# Patient Record
Sex: Female | Born: 1975
Health system: Southern US, Community
[De-identification: ages and names within clinical notes are randomized; demographics above are authoritative.]

## PROBLEM LIST (undated history)

## (undated) DIAGNOSIS — I10 Essential (primary) hypertension: Secondary | ICD-10-CM

## (undated) HISTORY — DX: Essential (primary) hypertension: I10

---

## 2015-01-23 DIAGNOSIS — Z87891 Personal history of nicotine dependence: Secondary | ICD-10-CM | POA: Insufficient documentation

## 2015-01-23 DIAGNOSIS — Z0000 Encounter for general adult medical examination without abnormal findings: Secondary | ICD-10-CM | POA: Insufficient documentation

## 2015-10-16 DIAGNOSIS — K219 Gastro-esophageal reflux disease without esophagitis: Secondary | ICD-10-CM | POA: Insufficient documentation

## 2016-05-23 DIAGNOSIS — Z6841 Body Mass Index (BMI) 40.0 and over, adult: Secondary | ICD-10-CM | POA: Insufficient documentation

## 2016-12-16 ENCOUNTER — Ambulatory Visit: Payer: Self-pay | Admitting: Podiatry

## 2016-12-25 ENCOUNTER — Ambulatory Visit (INDEPENDENT_AMBULATORY_CARE_PROVIDER_SITE_OTHER): Payer: BLUE CROSS/BLUE SHIELD

## 2016-12-25 ENCOUNTER — Ambulatory Visit (INDEPENDENT_AMBULATORY_CARE_PROVIDER_SITE_OTHER): Payer: BLUE CROSS/BLUE SHIELD | Admitting: Podiatry

## 2016-12-25 ENCOUNTER — Encounter: Payer: Self-pay | Admitting: Podiatry

## 2016-12-25 DIAGNOSIS — M779 Enthesopathy, unspecified: Secondary | ICD-10-CM | POA: Diagnosis not present

## 2016-12-25 DIAGNOSIS — M7661 Achilles tendinitis, right leg: Secondary | ICD-10-CM

## 2016-12-25 DIAGNOSIS — M25571 Pain in right ankle and joints of right foot: Secondary | ICD-10-CM

## 2016-12-25 DIAGNOSIS — M21619 Bunion of unspecified foot: Secondary | ICD-10-CM | POA: Diagnosis not present

## 2016-12-25 MED ORDER — DICLOFENAC SODIUM 75 MG PO TBEC: 75.0000 mg | DELAYED_RELEASE_TABLET | Freq: Two times a day (BID) | ORAL | 2 refills | Status: AC

## 2016-12-25 MED ORDER — TRIAMCINOLONE ACETONIDE 10 MG/ML IJ SUSP
10.0000 mg | Freq: Once | INTRAMUSCULAR | Status: AC
  Administered 2016-12-25: 10 mg

## 2016-12-25 NOTE — Progress Notes (Signed)
Chief Complaint  Patient presents with  . Foot Pain    Right; Back of heel pain that raidates to lateral side of ankle x 1 year. "Pain worsens the longer I stand"

## 2016-12-25 NOTE — Patient Instructions (Signed)

## 2016-12-25 NOTE — Progress Notes (Signed)
   Subjective:    Patient ID: Heidi Parker, female    DOB: 12-02-1975, 41 y.o.   MRN: [id number]  HPI  Chief Complaint  Patient presents with  . Foot Pain    Right; Back of heel pain that raidates to lateral side of ankle x 1 year. "Pain worsens the longer I stand"       Review of Systems     Objective:   Physical Exam        Assessment & Plan:

## 2016-12-26 NOTE — Progress Notes (Signed)
Subjective:    Patient ID: Heidi Parker, female   DOB: 41 y.o.   MRN: [id number]   HPI patient presents with severe discomfort in the back of the right heel stating it's been hurting her for around a year and gradually becoming more more of an issue    Review of Systems  All other systems reviewed and are negative.       Objective:  Physical Exam  Cardiovascular: Intact distal pulses.   Musculoskeletal: Normal range of motion.  Neurological: She is alert.  Skin: Skin is warm.  Nursing note and vitals reviewed.  neurovascular status intact muscle strength adequate range of motion within normal limits with patient found to have exquisite discomfort posterior lateral aspect right heel at the insertional point of the tendon the calcaneus with inflammation fluid as it inserts with no indications of tendon dysfunction and minimal equinus condition noted     Assessment:   Acute Achilles tendinitis right at the insertion into the calcaneus mostly lateral side      Plan:    H&P and education rendered concerning condition. At this point I did discuss injection and risk and I went ahead did a careful lateral injection 3 mg Dexon some Kenalog 5 mg Xylocaine and applied air fracture walker for complete immobilization and advised him reduced activity ice therapy placed on diclofenac 75 mg twice a day and reappoint to recheck in 2 weeks  X-rays indicate there is posterior spurring at the insertion of the tendon into the calcaneus

## 2017-01-22 ENCOUNTER — Ambulatory Visit (INDEPENDENT_AMBULATORY_CARE_PROVIDER_SITE_OTHER): Payer: BLUE CROSS/BLUE SHIELD | Admitting: Podiatry

## 2017-01-22 DIAGNOSIS — M7661 Achilles tendinitis, right leg: Secondary | ICD-10-CM

## 2017-01-22 NOTE — Progress Notes (Signed)
Subjective:    Patient ID: Heidi Parker, female   DOB: 41 y.o.   MRN: [id number]   HPI patient states that her posterior heel is feeling quite a bit better with immobilization    ROS      Objective:  Physical Exam neurovascular status intact with significant diminishment of discomfort in the posterior aspect of the heel with mild inflammation upon deep palpation     Assessment:   Achilles tendinitis improving with immobilization injection and ice therapy     Plan:    Advised on stretching exercises anti-inflammatories ice therapy and gradual reduction of the boot over the next 4 weeks. Dispensed a ankle compression stocking with silicone to reduce posterior pressure and advised on heel lift therapy

## 2018-01-26 ENCOUNTER — Telehealth: Payer: Self-pay | Admitting: *Deleted

## 2018-01-26 NOTE — Telephone Encounter (Signed)
Refill request for diclofenac. Pt has not been evaluated since 2018, and needs an appt. Return fax denying.

## 2018-06-03 ENCOUNTER — Other Ambulatory Visit: Payer: Self-pay | Admitting: Family Medicine

## 2018-06-03 ENCOUNTER — Ambulatory Visit
Admission: RE | Admit: 2018-06-03 | Discharge: 2018-06-03 | Disposition: A | Discharge status: Home / Self Care | Payer: Commercial Managed Care - PPO | Source: Ambulatory Visit | Attending: Family Medicine | Admitting: Family Medicine

## 2018-06-03 DIAGNOSIS — R200 Anesthesia of skin: Secondary | ICD-10-CM

## 2018-06-03 DIAGNOSIS — M542 Cervicalgia: Secondary | ICD-10-CM

## 2018-07-30 ENCOUNTER — Other Ambulatory Visit: Payer: Self-pay | Admitting: Neurosurgery

## 2018-07-30 DIAGNOSIS — M47812 Spondylosis without myelopathy or radiculopathy, cervical region: Secondary | ICD-10-CM

## 2018-08-07 ENCOUNTER — Ambulatory Visit
Admission: RE | Admit: 2018-08-07 | Discharge: 2018-08-07 | Disposition: A | Discharge status: Home / Self Care | Payer: Commercial Managed Care - PPO | Source: Ambulatory Visit | Attending: Neurosurgery | Admitting: Neurosurgery

## 2018-08-07 DIAGNOSIS — M47812 Spondylosis without myelopathy or radiculopathy, cervical region: Secondary | ICD-10-CM

## 2019-03-24 IMAGING — MR MR CERVICAL SPINE W/O CM
4 of 5 series · 27 of 48 positions shown · non-contrast
Comparison: Radiography 06/11/2018

CLINICAL DATA: Neck pain with bilateral arm weakness and hand
numbness over the last 6 months.

EXAM:
MRI CERVICAL SPINE WITHOUT CONTRAST
TECHNIQUE: Multiplanar, multisequence MR imaging of the cervical spine was
performed. No intravenous contrast was administered.

[Series 6: T1 · sagittal · 3.0mm · 0.66mm/px · 6 of 15 slices shown]
[im 1/15]
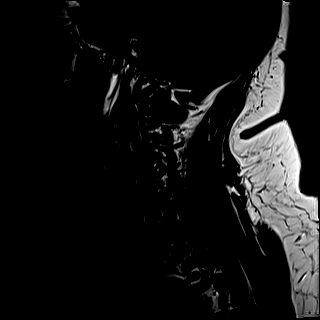
[im 3/15]
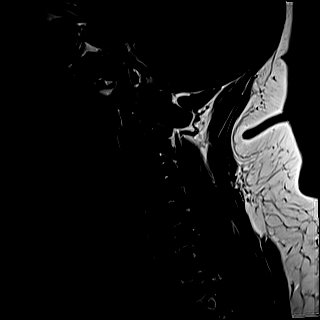
[im 6/15]
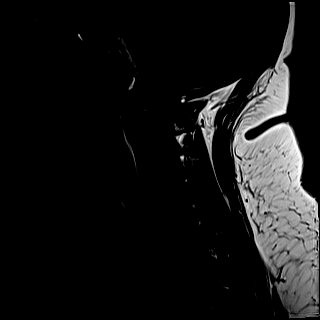
[im 9/15]
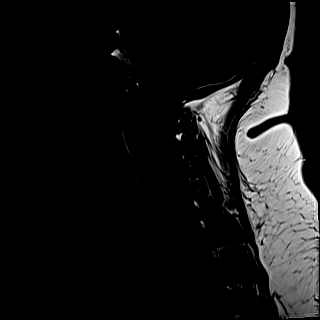
[im 12/15]
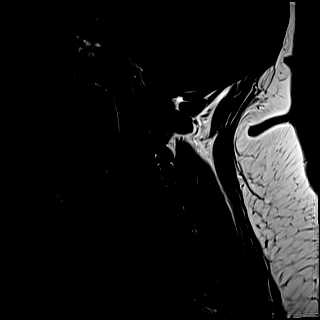
[im 15/15]
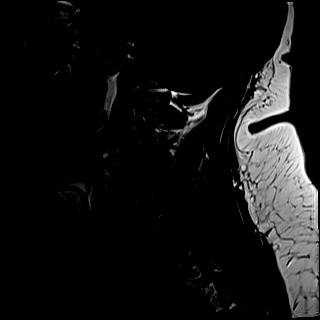

[Series 7: T2 · sagittal · 3.0mm · 0.55mm/px · 7 of 15 slices shown (1 of 2)]
[im 1/15]
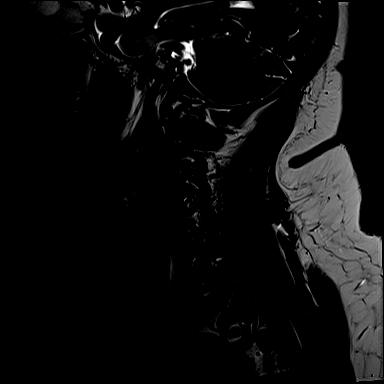
[im 3/15]
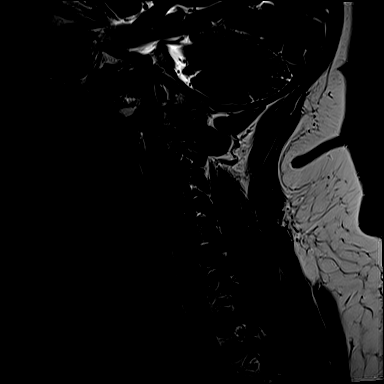
[im 5/15]
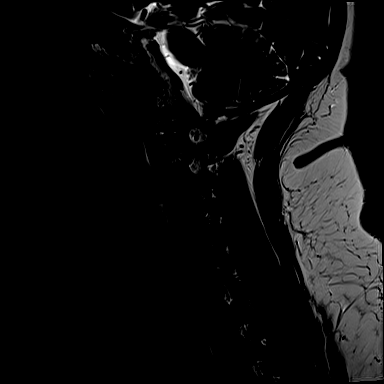
[im 8/15]
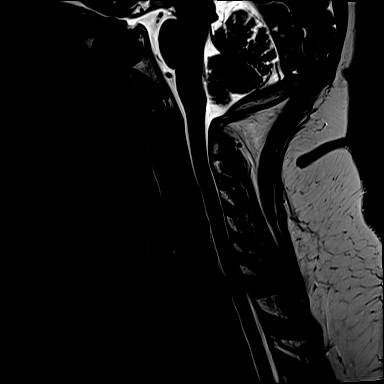
[im 10/15]
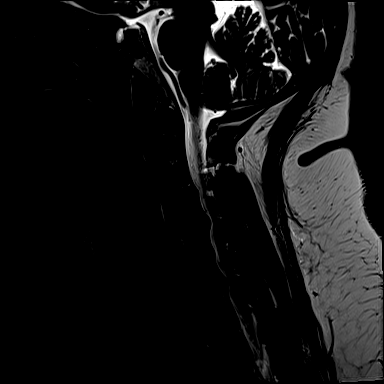
[im 12/15]
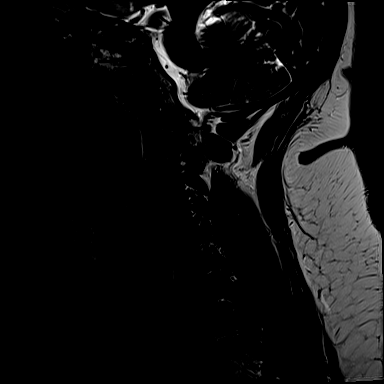
[im 15/15]
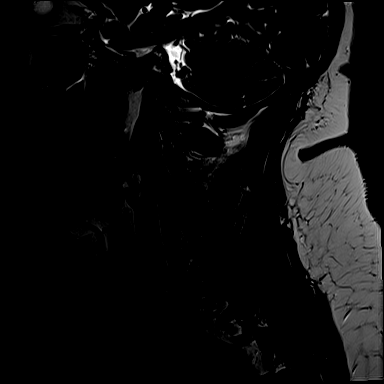

[Series 8: STIR · sagittal · 3.0mm · 0.33mm/px · 6 of 15 slices shown]
[im 1/15]
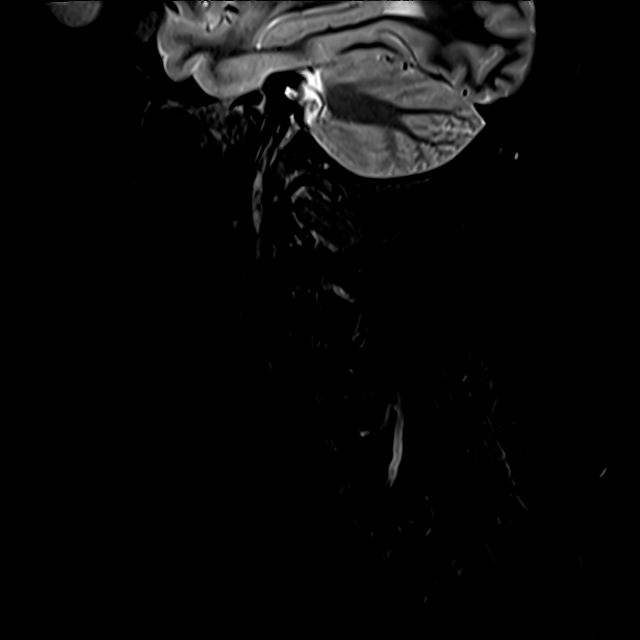
[im 3/15]
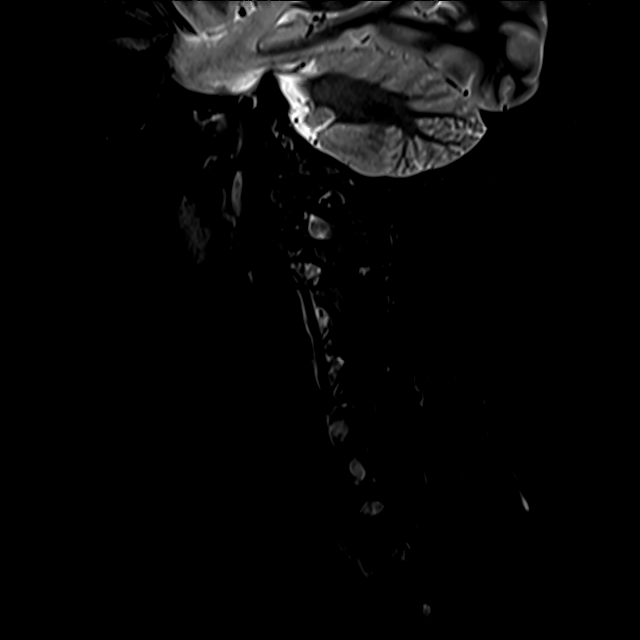
[im 5/15]
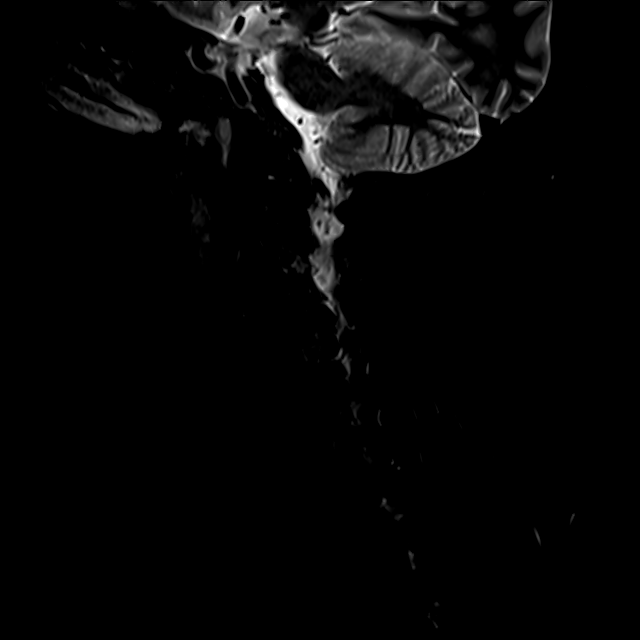
[im 8/15]
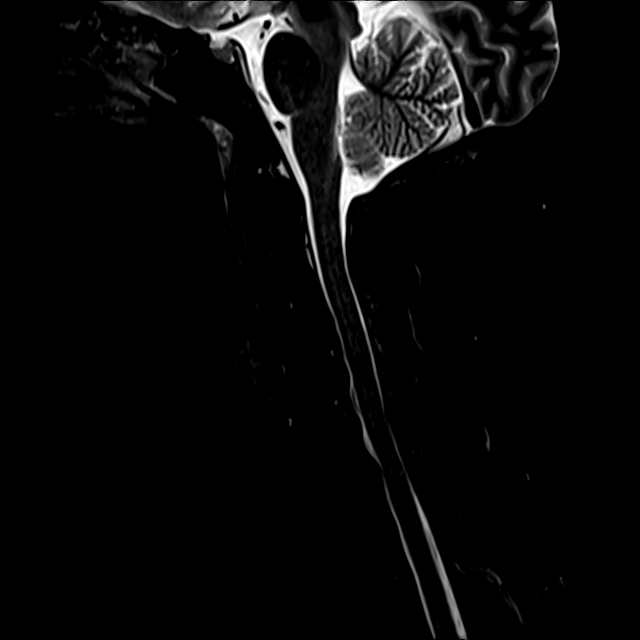
[im 10/15]
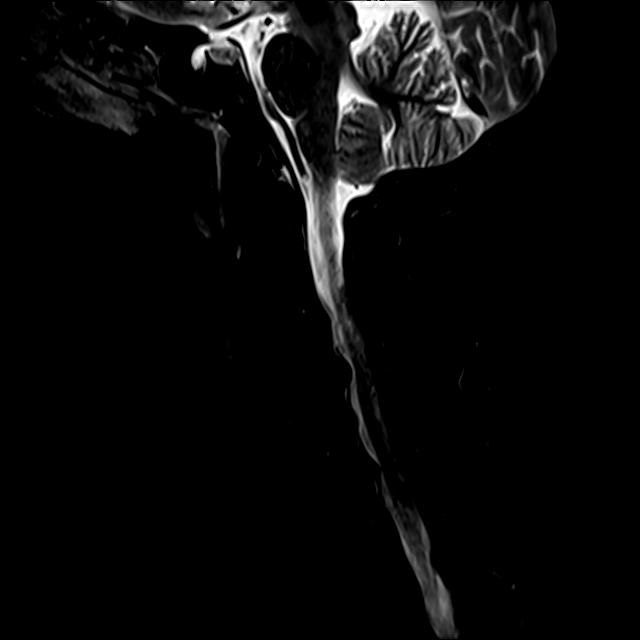
[im 12/15]
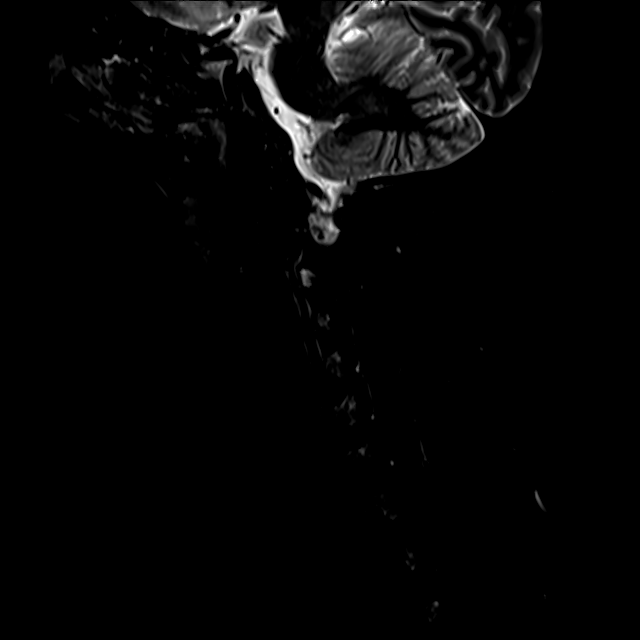

[Series 9: T2 · axial · 3.0mm · 0.50mm/px · z∈[-71,+25]mm · 8 of 32 slices shown (2 of 2)]
[im 1/32]
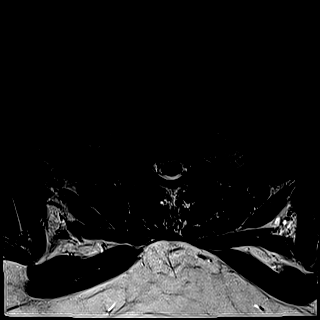
[im 5/32]
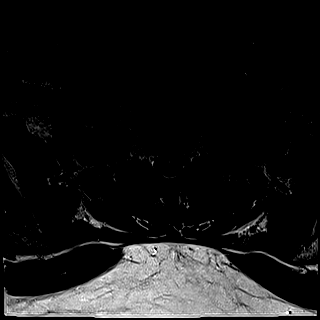
[im 10/32]
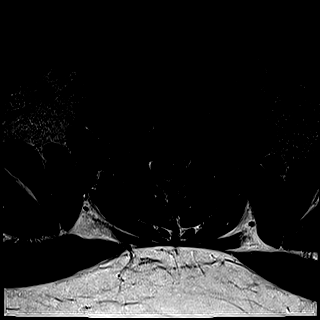
[im 15/32]
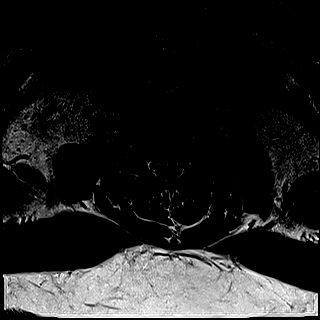
[im 17/32]
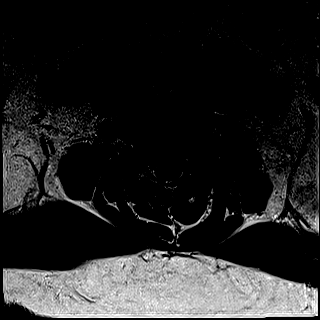
[im 22/32]
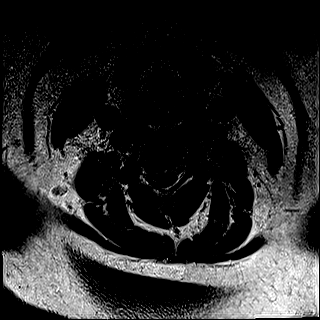
[im 27/32]
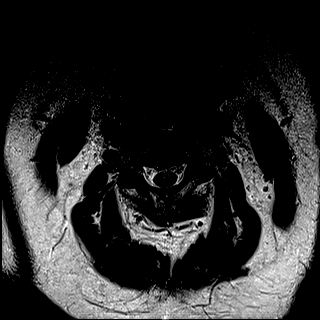
[im 32/32]
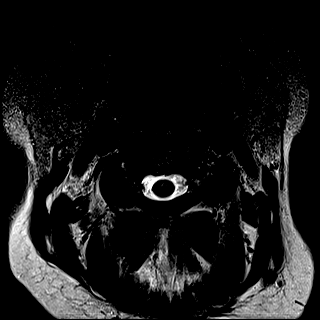

[27 of 48 positions shown; findings below may reference images not displayed]

FINDINGS: Alignment: Straightening of the normal cervical lordosis. No other
malalignment.

Vertebrae: No fracture or primary bone lesion.

Cord: No cord compression or primary cord lesion.

Posterior Fossa, vertebral arteries, paraspinal tissues: Negative

Disc levels:

No abnormality at the foramen magnum, C1-2 or C2-3.

C3-4: Chronic right-sided predominant spondylosis. Endplate
osteophytes and bulging of the disc. Narrowing of the ventral
subarachnoid space but no compression of the cord. Foraminal
stenosis on the right that could affect the right C4 nerve.

C4-5: Bilateral uncovertebral hypertrophy, more pronounced on the
left. Narrowing of the ventral subarachnoid space but no compression
of the cord. Bilateral bony foraminal narrowing, left more than
right. The left C5 nerve could be affected.

C5-6: Normal interspace.

C6-7: Shallow broad-based disc herniation that narrows the ventral
subarachnoid space but does not compress the cord. Mild extension
towards the proximal foramen on the left could affect the left C7
nerve.

C7-T1: Normal interspace.
IMPRESSION: C3-4: Right-sided predominant spondylosis with right foraminal
narrowing that could affect the right C4 nerve.

C4-5: Bilateral uncovertebral prominence more on the left with left
foraminal narrowing that could affect the left C5 nerve.

C6-7: Shallow disc herniation slightly more prominent towards the
left with proximal foraminal encroachment on the left that could
affect the left C7 nerve.

## 2022-01-28 ENCOUNTER — Other Ambulatory Visit: Payer: Self-pay | Admitting: Family Medicine

## 2022-01-28 DIAGNOSIS — Z1231 Encounter for screening mammogram for malignant neoplasm of breast: Secondary | ICD-10-CM

## 2022-02-08 ENCOUNTER — Ambulatory Visit
Admission: RE | Admit: 2022-02-08 | Discharge: 2022-02-08 | Disposition: A | Discharge status: Home / Self Care | Payer: Managed Care, Other (non HMO) | Source: Ambulatory Visit | Attending: Family Medicine | Admitting: Family Medicine

## 2022-02-08 DIAGNOSIS — Z1231 Encounter for screening mammogram for malignant neoplasm of breast: Secondary | ICD-10-CM

## 2022-12-20 ENCOUNTER — Ambulatory Visit
Admission: RE | Admit: 2022-12-20 | Discharge: 2022-12-20 | Disposition: A | Discharge status: Home / Self Care | Payer: Managed Care, Other (non HMO) | Source: Ambulatory Visit | Attending: Family Medicine | Admitting: Family Medicine

## 2022-12-20 ENCOUNTER — Other Ambulatory Visit: Payer: Self-pay | Admitting: Family Medicine

## 2022-12-20 ENCOUNTER — Ambulatory Visit: Admission: RE | Admit: 2022-12-20 | Payer: Managed Care, Other (non HMO) | Source: Ambulatory Visit

## 2022-12-20 DIAGNOSIS — R109 Unspecified abdominal pain: Secondary | ICD-10-CM

## 2023-01-29 ENCOUNTER — Other Ambulatory Visit: Payer: Self-pay | Admitting: Family Medicine

## 2023-01-29 DIAGNOSIS — Z1231 Encounter for screening mammogram for malignant neoplasm of breast: Secondary | ICD-10-CM

## 2023-02-11 ENCOUNTER — Ambulatory Visit
Admission: RE | Admit: 2023-02-11 | Discharge: 2023-02-11 | Disposition: A | Discharge status: Home / Self Care | Payer: Managed Care, Other (non HMO) | Source: Ambulatory Visit | Attending: Family Medicine | Admitting: Family Medicine

## 2023-02-11 DIAGNOSIS — Z1231 Encounter for screening mammogram for malignant neoplasm of breast: Secondary | ICD-10-CM

## 2023-08-18 ENCOUNTER — Ambulatory Visit
Admission: RE | Admit: 2023-08-18 | Discharge: 2023-08-18 | Disposition: A | Discharge status: Home / Self Care | Payer: No Typology Code available for payment source | Source: Ambulatory Visit | Attending: Family Medicine | Admitting: Family Medicine

## 2023-08-18 ENCOUNTER — Other Ambulatory Visit: Payer: Self-pay | Admitting: Family Medicine

## 2023-08-18 DIAGNOSIS — R051 Acute cough: Secondary | ICD-10-CM

## 2023-12-30 ENCOUNTER — Other Ambulatory Visit: Payer: Self-pay | Admitting: Family Medicine

## 2023-12-30 DIAGNOSIS — Z1231 Encounter for screening mammogram for malignant neoplasm of breast: Secondary | ICD-10-CM

## 2024-04-22 ENCOUNTER — Ambulatory Visit
Admission: RE | Admit: 2024-04-22 | Discharge: 2024-04-22 | Disposition: A | Discharge status: Home / Self Care | Payer: Self-pay | Source: Ambulatory Visit | Attending: Family Medicine | Admitting: Family Medicine

## 2024-04-22 DIAGNOSIS — Z1231 Encounter for screening mammogram for malignant neoplasm of breast: Secondary | ICD-10-CM
